# Patient Record
Sex: Male | Born: 1990 | Race: White | Hispanic: No | Marital: Single | Smoking: Never smoker
Health system: Southern US, Community
[De-identification: ages and names within clinical notes are randomized; demographics above are authoritative.]

---

## 2016-10-24 ENCOUNTER — Ambulatory Visit (HOSPITAL_COMMUNITY)
Admission: EM | Admit: 2016-10-24 | Discharge: 2016-10-24 | Disposition: A | Discharge status: Home / Self Care | Payer: Managed Care, Other (non HMO) | Attending: Family Medicine | Admitting: Family Medicine

## 2016-10-24 ENCOUNTER — Encounter (HOSPITAL_COMMUNITY): Payer: Self-pay | Admitting: Emergency Medicine

## 2016-10-24 DIAGNOSIS — J029 Acute pharyngitis, unspecified: Secondary | ICD-10-CM

## 2016-10-24 LAB — POCT RAPID STREP A: STREPTOCOCCUS, GROUP A SCREEN (DIRECT): NEGATIVE

## 2016-10-24 MED ORDER — PHENOL 1.4 % MT LIQD: 1.0000 | OROMUCOSAL | 0 refills | Status: DC | PRN

## 2016-10-24 MED ORDER — CETIRIZINE-PSEUDOEPHEDRINE ER 5-120 MG PO TB12: 1.0000 | ORAL_TABLET | Freq: Every day | ORAL | 0 refills | Status: DC

## 2016-10-24 MED ORDER — BENZONATATE 100 MG PO CAPS: 100.0000 mg | ORAL_CAPSULE | Freq: Three times a day (TID) | ORAL | 0 refills | Status: DC

## 2016-10-24 MED ORDER — FLUTICASONE PROPIONATE 50 MCG/ACT NA SUSP: 2.0000 | Freq: Every day | NASAL | 0 refills | Status: DC

## 2016-10-24 NOTE — ED Triage Notes (Signed)
Onset Friday night of sore throat, coughing up phlegm, losing voice.

## 2016-10-24 NOTE — ED Provider Notes (Signed)
[facility]    CSN: [id number] Arrival date & time: 10/24/16  1429     History   Chief Complaint Chief Complaint  Patient presents with  . Sore Throat    HPI John Velazquez is a 26 y.o. male.   26 year old male comes in for 4-5 day history of productive cough, sore throat, loss of voice. Subjective fever. Denies nasal congestion, ear pain, eye pain. Had some abdominal pain for a few days, but symptoms has since resolved. Denies nausea/vomiting. Denies chest pain, shortness of breath, wheezing. States painful to swallow, but denies trouble swallowing. Does state tonsil is more enlarged than usual. Has been taking ibuprofen/motrin for pain and subjective fever. Last took 2 hours prior to arrival. No sick contact. Denies history of seasonal allergies, asthma. Never smoker.       History reviewed. No pertinent past medical history.  There are no active problems to display for this patient.   History reviewed. No pertinent surgical history.     Home Medications    Prior to Admission medications   Medication Sig Start Date End Date Taking? Authorizing Provider  acetaminophen (TYLENOL) 325 MG tablet Take 650 mg by mouth every 6 (six) hours as needed.   Yes [provider]  ibuprofen (ADVIL,MOTRIN) 200 MG tablet Take 200 mg by mouth every 6 (six) hours as needed.   Yes [provider]  benzonatate (TESSALON) 100 MG capsule Take 1 capsule (100 mg total) by mouth every 8 (eight) hours. 10/24/16   Cathie Hoops, Amy V, PA-C  cetirizine-pseudoephedrine (ZYRTEC-D) 5-120 MG tablet Take 1 tablet by mouth daily. 10/24/16   Cathie Hoops, Amy V, PA-C  fluticasone (FLONASE) 50 MCG/ACT nasal spray Place 2 sprays into both nostrils daily. 10/24/16   Cathie Hoops, Amy V, PA-C  phenol (CHLORASEPTIC) 1.4 % LIQD Use as directed 1 spray in the mouth or throat as needed for throat irritation / pain. 10/24/16   Belinda Fisher, PA-C    Family History No family history on file.  Social History Social History    Substance Use Topics  . Smoking status: Never Smoker  . Smokeless tobacco: Not on file  . Alcohol use Yes     Allergies   Patient has no known allergies.   Review of Systems Review of Systems  Reason unable to perform ROS: See HPI as above.     Physical Exam Triage Vital Signs ED Triage Vitals  Enc Vitals Group     BP 10/24/16 1456 132/73     Pulse Rate 10/24/16 1456 65     Resp 10/24/16 1456 18     Temp 10/24/16 1456 98.4 F (36.9 C)     Temp Source 10/24/16 1456 Oral     SpO2 10/24/16 1456 97 %     Weight --      Height --      Head Circumference --      Peak Flow --      Pain Score 10/24/16 1454 6     Pain Loc --      Pain Edu? --      Excl. in GC? --    No data found.   Updated Vital Signs BP 132/73 (BP Location: Right Arm)   Pulse 65   Temp 98.4 F (36.9 C) (Oral)   Resp 18   SpO2 97%    Physical Exam  Constitutional: He is oriented to person, place, and time. He appears well-developed and well-nourished. No distress.  HENT:  Head: Normocephalic and atraumatic.  Right Ear: External ear and ear canal normal.  Left Ear: External ear and ear canal normal.  Nose: Nose normal. Right sinus exhibits no maxillary sinus tenderness and no frontal sinus tenderness. Left sinus exhibits no maxillary sinus tenderness and no frontal sinus tenderness.  Mouth/Throat: Uvula is midline and mucous membranes are normal. Posterior oropharyngeal erythema present. Tonsils are 3+ on the right. Tonsils are 3+ on the left. No tonsillar exudate.  Cerumen impaction bilaterally, TM not visible.   Eyes: Pupils are equal, round, and reactive to light. Conjunctivae are normal.  Neck: Normal range of motion. Neck supple.  Cardiovascular: Normal rate, regular rhythm and normal heart sounds.  Exam reveals no gallop and no friction rub.   No murmur heard. Pulmonary/Chest: Effort normal and breath sounds normal. He has no decreased breath sounds. He has no wheezes. He has no rhonchi.  He has no rales.  Lymphadenopathy:    He has no cervical adenopathy.  Neurological: He is alert and oriented to person, place, and time.  Skin: Skin is warm and dry.  Psychiatric: He has a normal mood and affect. His behavior is normal. Judgment normal.     UC Treatments / Results  Labs (all labs ordered are listed, but only abnormal results are displayed) Labs Reviewed  CULTURE, GROUP A STREP [facility])  POCT RAPID STREP A    EKG  EKG Interpretation None       Radiology No results found.  Procedures Procedures (including critical care time)  Medications Ordered in UC Medications - No data to display   Initial Impression / Assessment and Plan / UC Course  I have reviewed the triage vital signs and the nursing notes.  Pertinent labs & imaging results that were available during my care of the patient were reviewed by me and considered in my medical decision making (see chart for details).    Rapid strep negative. Symptomatic treatment as needed. Return precautions given.    Final Clinical Impressions(s) / UC Diagnoses   Final diagnoses:  Viral pharyngitis    New Prescriptions Discharge Medication List as of 10/24/2016  3:52 PM    START taking these medications   Details  benzonatate (TESSALON) 100 MG capsule Take 1 capsule (100 mg total) by mouth every 8 (eight) hours., Starting Tue 10/24/2016, Normal    cetirizine-pseudoephedrine (ZYRTEC-D) 5-120 MG tablet Take 1 tablet by mouth daily., Starting Tue 10/24/2016, Normal    fluticasone (FLONASE) 50 MCG/ACT nasal spray Place 2 sprays into both nostrils daily., Starting Tue 10/24/2016, Normal    phenol (CHLORASEPTIC) 1.4 % LIQD Use as directed 1 spray in the mouth or throat as needed for throat irritation / pain., Starting Tue 10/24/2016, Normal           Linward Headland V, PA-C 10/24/16 1611

## 2016-10-24 NOTE — Discharge Instructions (Signed)
Rapid strep negative. Symptoms are most likely due to viral illness. Start phenol for sore throat. Tessalon for cough. Flonase and/or Zyrtec-D for nasal congestion. You can use over the counter nasal saline rinse such as neti pot for nasal congestion. Monitor for any worsening of symptoms, swelling of the throat, trouble breathing, trouble swallowing, chest pain, shortness of breath, wheezing follow up for reevaluation.

## 2016-10-27 LAB — CULTURE, GROUP A STREP (THRC)

## 2017-07-06 ENCOUNTER — Encounter: Payer: Self-pay | Admitting: Podiatry

## 2017-07-06 ENCOUNTER — Ambulatory Visit (INDEPENDENT_AMBULATORY_CARE_PROVIDER_SITE_OTHER): Payer: Managed Care, Other (non HMO)

## 2017-07-06 ENCOUNTER — Ambulatory Visit (INDEPENDENT_AMBULATORY_CARE_PROVIDER_SITE_OTHER): Payer: Managed Care, Other (non HMO) | Admitting: Podiatry

## 2017-07-06 ENCOUNTER — Other Ambulatory Visit: Payer: Self-pay | Admitting: Podiatry

## 2017-07-06 VITALS — BP 125/75 | HR 73 | Resp 16

## 2017-07-06 DIAGNOSIS — M25572 Pain in left ankle and joints of left foot: Secondary | ICD-10-CM

## 2017-07-06 DIAGNOSIS — S9032XA Contusion of left foot, initial encounter: Secondary | ICD-10-CM | POA: Diagnosis not present

## 2017-07-06 MED ORDER — DICLOFENAC SODIUM 75 MG PO TBEC: 75.0000 mg | DELAYED_RELEASE_TABLET | Freq: Two times a day (BID) | ORAL | 2 refills | Status: AC

## 2017-07-06 NOTE — Progress Notes (Signed)
Subjective:   Patient ID: John Velazquez, male   DOB: 27 y.o.   MRN: [id number]   HPI Patient stated he had a significant skateboard incident last night he is having severe pain in his left heel and cannot bear any plantar weight on.  Does not smoke and likes to be active   Review of Systems  All other systems reviewed and are negative.       Objective:  Physical Exam  Constitutional: He appears well-developed and well-nourished.  Cardiovascular: Intact distal pulses.  Pulmonary/Chest: Effort normal.  Musculoskeletal: Normal range of motion.  Neurological: He is alert.  Skin: Skin is warm.  Nursing note and vitals reviewed.   Neurovascular status found to be intact muscle strength is adequate range of motion within normal limits with patient found to have exquisite discomfort plantar aspect left heel pain on both the medial and lateral side     Assessment:  Severe trauma to the left heel with possibility for fracture     Plan:  H&P condition reviewed and at this point I recommended immobilization with air fracture walker along with aggressive ice therapy oral diclofenac 75 mg twice daily.  If symptoms persist over the next 3 to 4 weeks I want him to see back  X-ray indicates no signs of fracture or calcaneal talar injury

## 2018-02-11 ENCOUNTER — Ambulatory Visit (HOSPITAL_COMMUNITY)
Admission: EM | Admit: 2018-02-11 | Discharge: 2018-02-11 | Disposition: A | Discharge status: Home / Self Care | Payer: Managed Care, Other (non HMO) | Attending: Internal Medicine | Admitting: Internal Medicine

## 2018-02-11 ENCOUNTER — Encounter (HOSPITAL_COMMUNITY): Payer: Self-pay

## 2018-02-11 ENCOUNTER — Other Ambulatory Visit: Payer: Self-pay

## 2018-02-11 DIAGNOSIS — J40 Bronchitis, not specified as acute or chronic: Secondary | ICD-10-CM

## 2018-02-11 LAB — POCT RAPID STREP A: Streptococcus, Group A Screen (Direct): NEGATIVE

## 2018-02-11 MED ORDER — PREDNISONE 50 MG PO TABS: 50.0000 mg | ORAL_TABLET | Freq: Every day | ORAL | 0 refills | Status: AC

## 2018-02-11 MED ORDER — AZITHROMYCIN 250 MG PO TABS: 250.0000 mg | ORAL_TABLET | Freq: Every day | ORAL | 0 refills | Status: AC

## 2018-02-11 MED ORDER — FLUTICASONE PROPIONATE 50 MCG/ACT NA SUSP: 1.0000 | Freq: Every day | NASAL | 0 refills | Status: AC

## 2018-02-11 MED ORDER — PSEUDOEPH-BROMPHEN-DM 30-2-10 MG/5ML PO SYRP: 5.0000 mL | ORAL_SOLUTION | Freq: Four times a day (QID) | ORAL | 0 refills | Status: AC | PRN

## 2018-02-11 MED ORDER — CETIRIZINE HCL 10 MG PO CAPS: 10.0000 mg | ORAL_CAPSULE | Freq: Every day | ORAL | 0 refills | Status: AC

## 2018-02-11 NOTE — ED Triage Notes (Signed)
Pt cc coughing and congestion x 2 weeks or more.

## 2018-02-11 NOTE — ED Provider Notes (Signed)
[facility]    CSN: [id number] Arrival date & time: 02/11/18  0908     History   Chief Complaint Chief Complaint  Patient presents with  . Cough    HPI John Velazquez is a 28 y.o. male no significant past medical history, Patient is presenting with URI symptoms- congestion, cough, sore throat.  Sore throat has resolved.  Patient's main complaints are persistence of symptoms, states that they have come and gone over the past couple weeks. Symptoms have been going on for 2 to 3 weeks. Patient has tried cough suppressant and ibuprofen, with minimal relief. Denies fever, nausea, vomiting, diarrhea. Denies shortness of breath and chest pain.  Has felt a sensation of wheezing especially in the morning when he wakes up.  Symptoms feel largely in the chest, minimal lingering URI symptoms.  HPI  History reviewed. No pertinent past medical history.  There are no active problems to display for this patient.   History reviewed. No pertinent surgical history.     Home Medications    Prior to Admission medications   Medication Sig Start Date End Date Taking? Authorizing Provider  azithromycin (ZITHROMAX) 250 MG tablet Take 1 tablet (250 mg total) by mouth daily. Take first 2 tablets together, then 1 every day until finished. 02/11/18   Lulia Schriner C, PA-C  brompheniramine-pseudoephedrine-DM 30-2-10 MG/5ML syrup Take 5 mLs by mouth 4 (four) times daily as needed. 02/11/18   Ionna Avis C, PA-C  Cetirizine HCl 10 MG CAPS Take 1 capsule (10 mg total) by mouth daily for 10 days. 02/11/18 02/21/18  Addie Alonge C, PA-C  diclofenac (VOLTAREN) 75 MG EC tablet Take 1 tablet (75 mg total) by mouth 2 (two) times daily. 07/06/17   Lenn Sink, DPM  fluticasone (FLONASE) 50 MCG/ACT nasal spray Place 1-2 sprays into both nostrils daily for 7 days. 02/11/18 02/18/18  Tranesha Lessner C, PA-C  predniSONE (DELTASONE) 50 MG tablet Take 1 tablet (50 mg total) by mouth daily for 5 days. 02/11/18  02/16/18  Anokhi Shannon, Junius Creamer, PA-C    Family History History reviewed. No pertinent family history.  Social History Social History   Tobacco Use  . Smoking status: Never Smoker  . Smokeless tobacco: Never Used  Substance Use Topics  . Alcohol use: Yes  . Drug use: No     Allergies   Patient has no known allergies.   Review of Systems Review of Systems  Constitutional: Negative for activity change, appetite change, chills, fatigue and fever.  HENT: Positive for congestion and rhinorrhea. Negative for ear pain, sinus pressure, sore throat and trouble swallowing.   Eyes: Negative for discharge and redness.  Respiratory: Positive for cough and wheezing. Negative for chest tightness and shortness of breath.   Cardiovascular: Negative for chest pain.  Gastrointestinal: Negative for abdominal pain, diarrhea, nausea and vomiting.  Musculoskeletal: Negative for myalgias.  Skin: Negative for rash.  Neurological: Negative for dizziness, light-headedness and headaches.     Physical Exam Triage Vital Signs ED Triage Vitals  Enc Vitals Group     BP 02/11/18 0933 133/81     Pulse --      Resp 02/11/18 0933 18     Temp 02/11/18 0933 98.5 F (36.9 C)     Temp Source 02/11/18 0933 Oral     SpO2 02/11/18 0933 100 %     Weight 02/11/18 0933 170 lb (77.1 kg)     Height --      Head Circumference --  Peak Flow --      Pain Score 02/11/18 0932 6     Pain Loc --      Pain Edu? --      Excl. in GC? --    No data found.  Updated Vital Signs BP 133/81 (BP Location: Right Arm)   Temp 98.5 F (36.9 C) (Oral)   Resp 18   Wt 170 lb (77.1 kg)   SpO2 100%   Visual Acuity Right Eye Distance:   Left Eye Distance:   Bilateral Distance:    Right Eye Near:   Left Eye Near:    Bilateral Near:     Physical Exam Vitals signs and nursing note reviewed.  Constitutional:      Appearance: He is well-developed.  HENT:     Head: Normocephalic and atraumatic.     Ears:      Comments: Bilateral ears without tenderness to palpation of external auricle, tragus and mastoid, EAC's without erythema or swelling, TM's with good bony landmarks and cone of light. Non erythematous.    Nose:     Comments: Nasal mucosa erythematous, swollen turbinate especially on right nares, clear rhinorrhea present bilaterally    Mouth/Throat:     Comments: Oral mucosa pink and moist, no tonsillar enlargement or exudate. Posterior pharynx patent and nonerythematous, no uvula deviation or swelling. Normal phonation.  Eyes:     Conjunctiva/sclera: Conjunctivae normal.  Neck:     Musculoskeletal: Neck supple.  Cardiovascular:     Rate and Rhythm: Normal rate and regular rhythm.     Heart sounds: No murmur.  Pulmonary:     Effort: Pulmonary effort is normal. No respiratory distress.     Breath sounds: Normal breath sounds.     Comments: Breathing comfortably at rest, CTABL, no wheezing, rales or other adventitious sounds auscultated Abdominal:     Palpations: Abdomen is soft.     Tenderness: There is no abdominal tenderness.  Skin:    General: Skin is warm and dry.  Neurological:     Mental Status: He is alert.      UC Treatments / Results  Labs (all labs ordered are listed, but only abnormal results are displayed) Labs Reviewed  CULTURE, GROUP A STREP [facility]  POCT RAPID STREP A    EKG None  Radiology No results found.  Procedures Procedures (including critical care time)  Medications Ordered in UC Medications - No data to display  Initial Impression / Assessment and Plan / UC Course  I have reviewed the triage vital signs and the nursing notes.  Pertinent labs & imaging results that were available during my care of the patient were reviewed by me and considered in my medical decision making (see chart for details).     Patient with URI symptoms and cough for 2 to 3 weeks.  Symptoms largely in chest.  Exam nonfocal.  Will cover patient for atypical respiratory  illness with azithromycin as well as treat for bronchitis given reporting wheezing.  Prednisone and brompheniramine cough syrup.  Also recommended Flonase given swelling in nares.  Recommendations below.Discussed strict return precautions. Patient verbalized understanding and is agreeable with plan.  Final Clinical Impressions(s) / UC Diagnoses   Final diagnoses:  Bronchitis     Discharge Instructions     I am treating you for bronchitis Please begin azithromycin 2 tablets today-1 tablet for the following 4 days Please begin prednisone daily with food for the next 5 days to help with inflammation in chest Please  use cough syrup provided as needed for cough and congestion, this also has Sudafed in it Your sinuses also appeared red and swollen, please begin daily cetirizine and Flonase nasal spray to help with this and drainage. Drink plenty of fluids Tylenol and ibuprofen as needed for headaches, body aches or fevers  Please follow-up if symptoms not resolving with the above treatment, worsening, developing difficulty breathing, shortness of breath, worsening chest discomfort, fevers   ED Prescriptions    Medication Sig Dispense Auth. Provider   azithromycin (ZITHROMAX) 250 MG tablet Take 1 tablet (250 mg total) by mouth daily. Take first 2 tablets together, then 1 every day until finished. 6 tablet Raesha Coonrod C, PA-C   predniSONE (DELTASONE) 50 MG tablet Take 1 tablet (50 mg total) by mouth daily for 5 days. 5 tablet Emaline Karnes C, PA-C   brompheniramine-pseudoephedrine-DM 30-2-10 MG/5ML syrup Take 5 mLs by mouth 4 (four) times daily as needed. 120 mL Wardell Pokorski C, PA-C   fluticasone (FLONASE) 50 MCG/ACT nasal spray Place 1-2 sprays into both nostrils daily for 7 days. 1 g Lacye Mccarn C, PA-C   Cetirizine HCl 10 MG CAPS Take 1 capsule (10 mg total) by mouth daily for 10 days. 10 capsule Mackynzie Woolford C, PA-C     Controlled Substance Prescriptions [place] Controlled  Substance Registry consulted? Not Applicable   Lew Dawes, [place] 02/11/18 1107

## 2018-02-11 NOTE — Discharge Instructions (Signed)
I am treating you for bronchitis Please begin azithromycin 2 tablets today-1 tablet for the following 4 days Please begin prednisone daily with food for the next 5 days to help with inflammation in chest Please use cough syrup provided as needed for cough and congestion, this also has Sudafed in it Your sinuses also appeared red and swollen, please begin daily cetirizine and Flonase nasal spray to help with this and drainage. Drink plenty of fluids Tylenol and ibuprofen as needed for headaches, body aches or fevers  Please follow-up if symptoms not resolving with the above treatment, worsening, developing difficulty breathing, shortness of breath, worsening chest discomfort, fevers

## 2018-02-13 LAB — CULTURE, GROUP A STREP (THRC)

## 2018-11-08 ENCOUNTER — Other Ambulatory Visit: Payer: Self-pay

## 2018-11-08 ENCOUNTER — Emergency Department (HOSPITAL_COMMUNITY)
Admission: EM | Admit: 2018-11-08 | Discharge: 2018-11-09 | Disposition: A | Discharge status: Home / Self Care | Payer: Worker's Compensation | Attending: Emergency Medicine | Admitting: Emergency Medicine

## 2018-11-08 ENCOUNTER — Emergency Department (HOSPITAL_COMMUNITY): Payer: Worker's Compensation | Attending: Emergency Medicine

## 2018-11-08 ENCOUNTER — Encounter (HOSPITAL_COMMUNITY): Payer: Self-pay

## 2018-11-08 DIAGNOSIS — Y9389 Activity, other specified: Secondary | ICD-10-CM | POA: Insufficient documentation

## 2018-11-08 DIAGNOSIS — Y929 Unspecified place or not applicable: Secondary | ICD-10-CM | POA: Diagnosis not present

## 2018-11-08 DIAGNOSIS — Y990 Civilian activity done for income or pay: Secondary | ICD-10-CM | POA: Diagnosis not present

## 2018-11-08 DIAGNOSIS — Y999 Unspecified external cause status: Secondary | ICD-10-CM | POA: Diagnosis not present

## 2018-11-08 DIAGNOSIS — W268XXA Contact with other sharp object(s), not elsewhere classified, initial encounter: Secondary | ICD-10-CM | POA: Diagnosis not present

## 2018-11-08 DIAGNOSIS — S61213A Laceration without foreign body of left middle finger without damage to nail, initial encounter: Secondary | ICD-10-CM

## 2018-11-08 DIAGNOSIS — Z79899 Other long term (current) drug therapy: Secondary | ICD-10-CM | POA: Diagnosis not present

## 2018-11-08 DIAGNOSIS — Z23 Encounter for immunization: Secondary | ICD-10-CM | POA: Diagnosis not present

## 2018-11-08 NOTE — ED Triage Notes (Signed)
Pt reports R middle finger laceration. He was cleaning the deli slicer at work. Bleeding controlled. Unknown last tetanus.

## 2018-11-09 MED ORDER — BACITRACIN ZINC 500 UNIT/GM EX OINT
TOPICAL_OINTMENT | Freq: Two times a day (BID) | CUTANEOUS | Status: DC
  Administered 2018-11-09: 02:00:00 via TOPICAL
  Filled 2018-11-09: qty 0.9

## 2018-11-09 MED ORDER — BACITRACIN ZINC 500 UNIT/GM EX OINT: 1.0000 "application " | TOPICAL_OINTMENT | Freq: Two times a day (BID) | CUTANEOUS | 0 refills | Status: AC

## 2018-11-09 MED ORDER — TETANUS-DIPHTH-ACELL PERTUSSIS 5-2.5-18.5 LF-MCG/0.5 IM SUSP
0.5000 mL | Freq: Once | INTRAMUSCULAR | Status: AC
  Administered 2018-11-09: 02:00:00 0.5 mL via INTRAMUSCULAR
  Filled 2018-11-09: qty 0.5

## 2018-11-09 MED ORDER — LIDOCAINE HCL 2 % IJ SOLN
10.0000 mL | Freq: Once | INTRAMUSCULAR | Status: AC
  Administered 2018-11-09: 02:00:00 200 mg
  Filled 2018-11-09: qty 20

## 2018-11-09 NOTE — Discharge Instructions (Signed)
You are seen in the ER after he had a laceration. The wound has been cleaned and repaired.  Please read the instructions provided on wound care. Keep the area clean and dry, apply bacitracin ointment daily and take the medications provided. RETURN TO THE ER IF THERE IS INCREASED PAIN, REDNESS, PUS COMING OUT from the wound site.

## 2018-11-09 NOTE — ED Notes (Signed)
Wound cleaned, bacitracin ointment and bandage applied. Discharge instructions reviewed with pt, pt verbalizes understanding and has no questions at this time. Pt ambulatory on discharge.

## 2018-11-09 NOTE — ED Provider Notes (Signed)
[facility]-EMERGENCY DEPT Provider Note   CSN: [id number] Arrival date & time: 11/08/18  2022     History   Chief Complaint Chief Complaint  Patient presents with  . Laceration    HPI John Velazquez is a 28 y.o. male.     HPI  28 year old male comes to the ER with chief complaint of work-related injury. Patient works at a [employer].  He reports that he injured his right long digit over a slicer while at work.  Injury occurred prior to arrival.  He is not up-to-date with tetanus.  He denies any associated numbness, tingling.  No injury elsewhere.  Bleeding controlled with pressure and dressing.  History reviewed. No pertinent past medical history.  There are no active problems to display for this patient.   History reviewed. No pertinent surgical history.      Home Medications    Prior to Admission medications   Medication Sig Start Date End Date Taking? Authorizing Provider  azithromycin (ZITHROMAX) 250 MG tablet Take 1 tablet (250 mg total) by mouth daily. Take first 2 tablets together, then 1 every day until finished. 02/11/18   Wieters, Hallie C, PA-C  bacitracin ointment Apply 1 application topically 2 (two) times daily. 11/09/18   Derwood Kaplan, MD  brompheniramine-pseudoephedrine-DM 30-2-10 MG/5ML syrup Take 5 mLs by mouth 4 (four) times daily as needed. 02/11/18   Wieters, Hallie C, PA-C  Cetirizine HCl 10 MG CAPS Take 1 capsule (10 mg total) by mouth daily for 10 days. 02/11/18 02/21/18  Wieters, Hallie C, PA-C  diclofenac (VOLTAREN) 75 MG EC tablet Take 1 tablet (75 mg total) by mouth 2 (two) times daily. 07/06/17   Lenn Sink, DPM  fluticasone (FLONASE) 50 MCG/ACT nasal spray Place 1-2 sprays into both nostrils daily for 7 days. 02/11/18 02/18/18  Wieters, Junius Creamer, PA-C    Family History History reviewed. No pertinent family history.  Social History Social History   Tobacco Use  . Smoking status: Never Smoker  . Smokeless tobacco: Never Used   Substance Use Topics  . Alcohol use: Yes  . Drug use: No     Allergies   Patient has no known allergies.   Review of Systems Review of Systems  Musculoskeletal: Negative for arthralgias.  Skin: Positive for rash and wound.  Allergic/Immunologic: Negative for immunocompromised state.  Neurological: Negative for numbness.  Hematological: Does not bruise/bleed easily.     Physical Exam Updated Vital Signs BP 121/71 (BP Location: Left Arm)   Pulse 67   Temp 98.3 F (36.8 C) (Oral)   Resp 16   SpO2 99%   Physical Exam Vitals signs and nursing note reviewed.  Constitutional:      Appearance: He is well-developed.  HENT:     Head: Atraumatic.  Cardiovascular:     Rate and Rhythm: Normal rate.  Pulmonary:     Effort: Pulmonary effort is normal.  Musculoskeletal:     Comments: Patient's range of motion over IP joint is normal.  Skin:    General: Skin is warm.     Comments: 3 cm laceration over the dorsum of the right hand long digit.  Laceration is about 3 cm long and has created a flap.  No active bleeding.  Neurological:     Mental Status: He is alert and oriented to person, place, and time.     Comments: No focal numbness over the entire right hand.      ED Treatments / Results  Labs (all  labs ordered are listed, but only abnormal results are displayed) Labs Reviewed - No data to display  EKG None  Radiology Dg Finger Middle Right  Result Date: 11/08/2018 CLINICAL DATA:  Right middle finger laceration EXAM: RIGHT MIDDLE FINGER 2+V COMPARISON:  None. FINDINGS: There is no evidence of fracture or dislocation. There is a superficial laceration seen over the lateral aspect of the finger with diffuse soft tissue swelling. No radiopaque foreign body. IMPRESSION: No acute osseous findings.  No radiopaque foreign body. Electronically Signed   By: Jonna ClarkBindu  Avutu M.D.   On: 11/08/2018 21:49    Procedures .Marland Kitchen.Laceration Repair  Date/Time: 11/09/2018 1:58 AM Performed  by: Derwood KaplanNanavati, Jovian Lembcke, MD Authorized by: Derwood KaplanNanavati, Mintie Witherington, MD   Consent:    Consent obtained:  Verbal   Consent given by:  Patient Anesthesia (see MAR for exact dosages):    Anesthesia method:  Nerve block Laceration details:    Location:  Finger   Finger location:  L long finger   Length (cm):  3   Depth (mm):  5 Repair type:    Repair type:  Intermediate Pre-procedure details:    Preparation:  Patient was prepped and draped in usual sterile fashion Exploration:    Wound exploration: wound explored through full range of motion and entire depth of wound probed and visualized     Wound extent: no foreign bodies/material noted, no tendon damage noted, no underlying fracture noted and no vascular damage noted     Contaminated: yes   Treatment:    Area cleansed with:  Saline and soap and water   Amount of cleaning:  Extensive   Irrigation solution:  Tap water   Irrigation volume:  100   Irrigation method:  Pressure wash and tap Skin repair:    Repair method:  Sutures   Suture size:  5-0   Suture material:  Nylon   Suture technique:  Simple interrupted   Number of sutures:  5 Approximation:    Approximation:  Close Post-procedure details:    Dressing:  Antibiotic ointment and non-adherent dressing   Patient tolerance of procedure:  Tolerated well, no immediate complications .Nerve Block  Date/Time: 11/09/2018 2:03 AM Performed by: Derwood KaplanNanavati, Rjay Revolorio, MD Authorized by: Derwood KaplanNanavati, Chrisangel Eskenazi, MD   Consent:    Consent obtained:  Verbal   Consent given by:  Patient   Risks discussed:  Allergic reaction, nerve damage, pain and infection   Alternatives discussed:  No treatment Universal protocol:    Procedure explained and questions answered to patient or proxy's satisfaction: yes     Patient identity confirmed:  Arm band Indications:    Indications:  Pain relief Location:    Body area:  Upper extremity   Upper extremity nerve:  Metacarpal   Laterality:  Right Pre-procedure details:     Skin preparation:  2% chlorhexidine   Preparation: Patient was prepped and draped in usual sterile fashion   Procedure details (see MAR for exact dosages):    Block needle gauge:  21 G   Anesthetic injected:  Lidocaine 2% w/o epi   Injection procedure:  Anatomic landmarks identified, anatomic landmarks palpated, introduced needle, negative aspiration for blood and incremental injection   Paresthesia:  Immediately resolved Post-procedure details:    Dressing:  None   Outcome:  Anesthesia achieved   Patient tolerance of procedure:  Tolerated well, no immediate complications   (including critical care time)  Medications Ordered in ED Medications  lidocaine (XYLOCAINE) 2 % (with pres) injection 200 mg (has no  administration in time range)  Tdap (BOOSTRIX) injection 0.5 mL (has no administration in time range)  bacitracin ointment (has no administration in time range)     Initial Impression / Assessment and Plan / ED Course  I have reviewed the triage vital signs and the nursing notes.  Pertinent labs & imaging results that were available during my care of the patient were reviewed by me and considered in my medical decision making (see chart for details).        28 year old comes in a chief complaint of laceration.  Laceration occurred whilst at work.  The wound was contaminated but has been cleaned appropriately in the ED.  Wound care precautions discussed after the laceration repair.  Patient is neurovascularly intact.  ER return precautions discussed.  Final Clinical Impressions(s) / ED Diagnoses   Final diagnoses:  Laceration of left middle finger without foreign body without damage to nail, initial encounter  Work related injury    ED Discharge Orders         Ordered    bacitracin ointment  2 times daily     11/09/18 0156           Varney Biles, MD 11/09/18 0205

## 2019-02-05 ENCOUNTER — Other Ambulatory Visit: Payer: Self-pay | Admitting: *Deleted

## 2019-02-05 DIAGNOSIS — Z20822 Contact with and (suspected) exposure to covid-19: Secondary | ICD-10-CM

## 2019-02-06 LAB — NOVEL CORONAVIRUS, NAA: SARS-CoV-2, NAA: NOT DETECTED

## 2020-02-20 IMAGING — CR DG FINGER MIDDLE 2+V*R*
3 series · 3 of 3 positions shown · non-contrast
Comparison: None.

CLINICAL DATA: Right middle finger laceration

EXAM:
RIGHT MIDDLE FINGER 2+V

[x finger pa right]
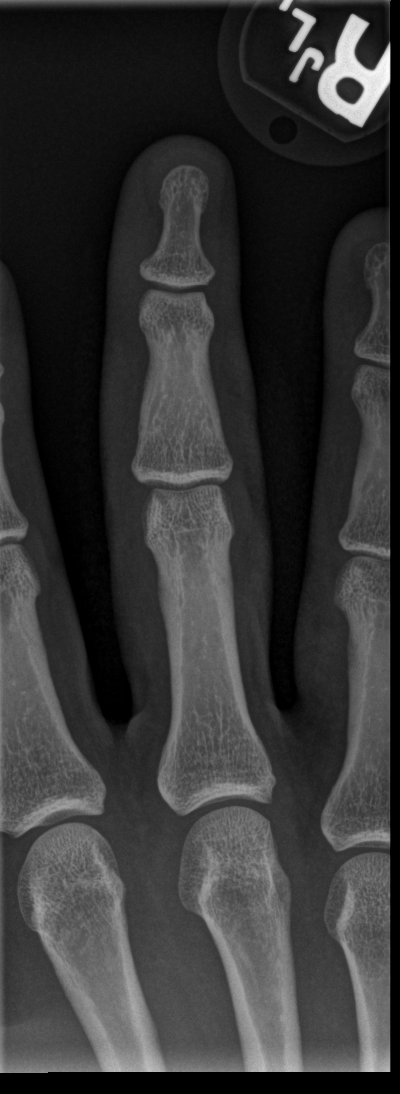

[x finger obl right]
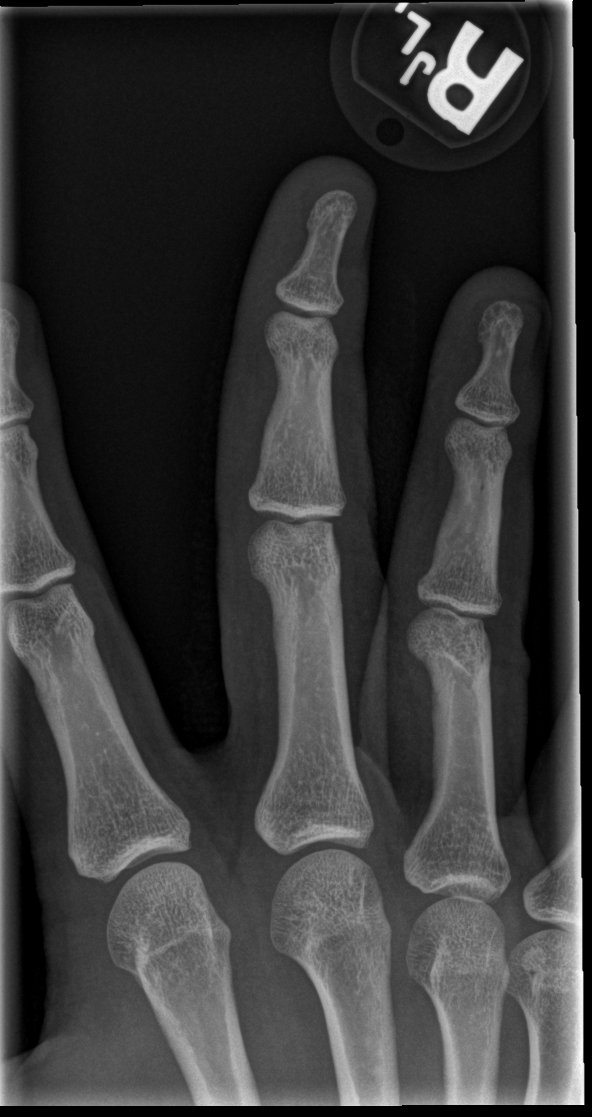

[x finger lat right]
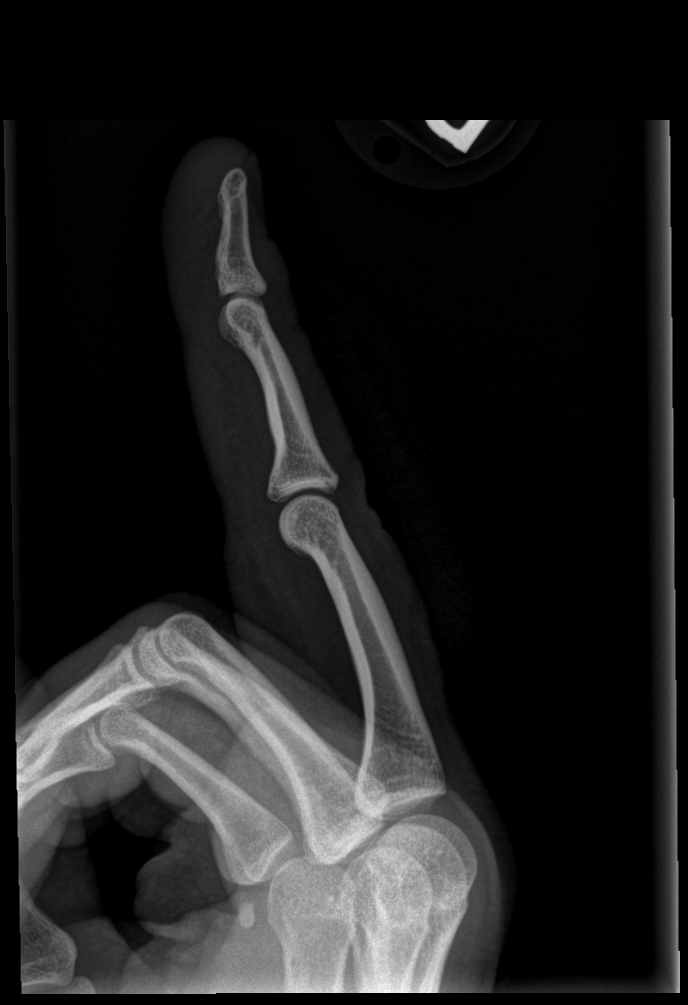

[3 of 3 positions shown; findings below may reference images not displayed]

FINDINGS: There is no evidence of fracture or dislocation. There is a
superficial laceration seen over the lateral aspect of the finger
with diffuse soft tissue swelling. No radiopaque foreign body.
IMPRESSION: No acute osseous findings.  No radiopaque foreign body.
